# Patient Record
Sex: Female | Born: 2000 | Race: Black or African American | Hispanic: No | Marital: Single | Smoking: Never smoker
Health system: Southern US, Community
[De-identification: ages and names within clinical notes are randomized; demographics above are authoritative.]

## PROBLEM LIST (undated history)

## (undated) DIAGNOSIS — R569 Unspecified convulsions: Secondary | ICD-10-CM

---
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
## 2018-04-06 ENCOUNTER — Emergency Department (HOSPITAL_COMMUNITY)
Admission: EM | Admit: 2018-04-06 | Discharge: 2018-04-07 | Disposition: A | Discharge status: Home / Self Care | Payer: Medicaid Other | Attending: Emergency Medicine | Admitting: Emergency Medicine

## 2018-04-06 ENCOUNTER — Other Ambulatory Visit: Payer: Self-pay

## 2018-04-06 ENCOUNTER — Encounter (HOSPITAL_COMMUNITY): Payer: Self-pay | Admitting: Emergency Medicine

## 2018-04-06 DIAGNOSIS — S161XXA Strain of muscle, fascia and tendon at neck level, initial encounter: Secondary | ICD-10-CM | POA: Insufficient documentation

## 2018-04-06 DIAGNOSIS — Y999 Unspecified external cause status: Secondary | ICD-10-CM | POA: Diagnosis not present

## 2018-04-06 DIAGNOSIS — Y9389 Activity, other specified: Secondary | ICD-10-CM | POA: Diagnosis not present

## 2018-04-06 DIAGNOSIS — S199XXA Unspecified injury of neck, initial encounter: Secondary | ICD-10-CM | POA: Diagnosis present

## 2018-04-06 DIAGNOSIS — Y929 Unspecified place or not applicable: Secondary | ICD-10-CM | POA: Diagnosis not present
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
## 2018-04-07 ENCOUNTER — Emergency Department (HOSPITAL_COMMUNITY): Payer: Medicaid Other
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
## 2018-04-07 NOTE — Discharge Instructions (Addendum)

## 2018-04-07 NOTE — ED Notes (Signed)
Taken to ct at this time. 

## 2018-06-01 ENCOUNTER — Emergency Department (HOSPITAL_COMMUNITY)
Admission: EM | Admit: 2018-06-01 | Discharge: 2018-06-01 | Disposition: A | Discharge status: Home / Self Care | Payer: Medicaid Other | Attending: Emergency Medicine | Admitting: Emergency Medicine

## 2018-06-01 ENCOUNTER — Encounter (HOSPITAL_COMMUNITY): Payer: Self-pay | Admitting: Emergency Medicine

## 2018-06-01 DIAGNOSIS — Z79899 Other long term (current) drug therapy: Secondary | ICD-10-CM | POA: Insufficient documentation

## 2018-06-01 DIAGNOSIS — G44219 Episodic tension-type headache, not intractable: Secondary | ICD-10-CM | POA: Insufficient documentation

## 2018-06-01 DIAGNOSIS — R519 Headache, unspecified: Secondary | ICD-10-CM

## 2018-06-01 DIAGNOSIS — T701XXA Sinus barotrauma, initial encounter: Secondary | ICD-10-CM | POA: Insufficient documentation

## 2018-06-01 DIAGNOSIS — R51 Headache: Secondary | ICD-10-CM

## 2018-06-01 DIAGNOSIS — J3489 Other specified disorders of nose and nasal sinuses: Secondary | ICD-10-CM

## 2018-06-01 LAB — I-STAT CHEM 8, ED
BUN: 9 mg/dL (ref 6–20)
CREATININE: 0.9 mg/dL (ref 0.44–1.00)
Calcium, Ion: 1.15 mmol/L (ref 1.15–1.40)
Chloride: 101 mmol/L (ref 98–111)
Glucose, Bld: 84 mg/dL (ref 70–99)
HCT: 45 % (ref 36.0–46.0)
Hemoglobin: 15.3 g/dL — ABNORMAL HIGH (ref 12.0–15.0)
Potassium: 3.5 mmol/L (ref 3.5–5.1)
SODIUM: 138 mmol/L (ref 135–145)
TCO2: 25 mmol/L (ref 22–32)

## 2018-06-01 LAB — I-STAT BETA HCG BLOOD, ED (MC, WL, AP ONLY)

## 2018-06-01 MED ORDER — IBUPROFEN 400 MG PO TABS
600.0000 mg | ORAL_TABLET | Freq: Once | ORAL | Status: AC
  Administered 2018-06-01: 16:00:00 600 mg via ORAL
  Filled 2018-06-01: qty 1

## 2018-06-01 MED ORDER — PROCHLORPERAZINE MALEATE 5 MG PO TABS
10.0000 mg | ORAL_TABLET | Freq: Once | ORAL | Status: AC
  Administered 2018-06-01: 10 mg via ORAL
  Filled 2018-06-01: qty 2

## 2018-06-01 MED ORDER — DIPHENHYDRAMINE HCL 25 MG PO CAPS
25.0000 mg | ORAL_CAPSULE | Freq: Once | ORAL | Status: AC
  Administered 2018-06-01: 25 mg via ORAL
  Filled 2018-06-01: qty 1

## 2018-06-01 MED ORDER — DEXAMETHASONE 4 MG PO TABS
10.0000 mg | ORAL_TABLET | Freq: Once | ORAL | Status: AC
  Administered 2018-06-01: 10 mg via ORAL
  Filled 2018-06-01: qty 3

## 2018-06-01 MED ORDER — FLUTICASONE PROPIONATE 50 MCG/ACT NA SUSP: 1.0000 | Freq: Every day | NASAL | 0 refills | Status: AC

## 2018-06-01 NOTE — ED Provider Notes (Signed)
[facility] Provider Note   CSN: [id number] Arrival date & time: 06/01/18  1216     History   Chief Complaint Chief Complaint  Patient presents with  . Headache    HPI Anne Allen is a 17 y.o. female.  The history is provided by the patient.  Headache   This is a new problem. The current episode started more than 2 days ago. The problem occurs every few hours. The problem has not changed since onset.The headache is associated with bright light. The pain is located in the frontal region. The quality of the pain is described as dull. The pain is at a severity of 2/10. The pain is mild. The pain does not radiate. Pertinent negatives include no anorexia, no fever, no malaise/fatigue, no near-syncope, no orthopnea, no palpitations, no syncope, no shortness of breath, no nausea and no vomiting. She has tried nothing for the symptoms. The treatment provided no relief.    Past Medical History:  Diagnosis Date  . Seizures (HCC)    none in several years    Patient Active Problem List   Diagnosis Date Noted  . MDD (major depressive disorder), recurrent episode, moderate (HCC) 12/30/2016  . Suicidal ideation 12/30/2016  . Substance abuse (HCC) 12/30/2016    Past Surgical History:  Procedure Laterality Date  . hx blood clot right arm/vein bypass and removol of right upper rib  2017     OB History   None      Home Medications    Prior to Admission medications   Medication Sig Start Date End Date Taking? Authorizing Provider  fluticasone (FLONASE) 50 MCG/ACT nasal spray Place 1 spray into both nostrils daily. 06/01/18 07/01/18  Veretta Sabourin, DO  hydrOXYzine (ATARAX/VISTARIL) 25 MG tablet Take 1 tablet (25 mg total) by mouth 3 (three) times daily as needed for anxiety. 01/02/17   Denzil Magnusonhomas, Lashunda, NP    Family History No family history on file.  Social History Social History   Tobacco Use  . Smoking status: Never Smoker  . Smokeless  tobacco: Never Used  Substance Use Topics  . Alcohol use: No    Comment: BAC not available at time of assessment  . Drug use: Yes    Types: Marijuana    Comment: UDS not available at time of assessment     Allergies   Patient has no known allergies.   Review of Systems Review of Systems  Constitutional: Negative for chills, fever and malaise/fatigue.  HENT: Positive for congestion, postnasal drip, rhinorrhea, sinus pressure and sinus pain. Negative for ear pain and sore throat.   Eyes: Positive for photophobia. Negative for pain, discharge, redness, itching and visual disturbance.  Respiratory: Negative for cough and shortness of breath.   Cardiovascular: Negative for chest pain, palpitations, orthopnea, syncope and near-syncope.  Gastrointestinal: Negative for abdominal pain, anorexia, nausea and vomiting.  Genitourinary: Negative for dysuria and hematuria.  Musculoskeletal: Negative for arthralgias and back pain.  Skin: Negative for color change and rash.  Neurological: Positive for headaches. Negative for dizziness, seizures, syncope, facial asymmetry and light-headedness.  All other systems reviewed and are negative.    Physical Exam Updated Vital Signs  ED Triage Vitals  Enc Vitals Group     BP 06/01/18 1234 119/70     Pulse Rate 06/01/18 1234 81     Resp 06/01/18 1234 18     Temp 06/01/18 1234 98.8 F (37.1 C)     Temp Source 06/01/18 1234 Oral  SpO2 06/01/18 1234 100 %     Weight --      Height --      Head Circumference --      Peak Flow --      Pain Score 06/01/18 1232 7     Pain Loc --      Pain Edu? --      Excl. in GC? --     Physical Exam  Constitutional: She is oriented to person, place, and time. She appears well-developed and well-nourished. No distress.  HENT:  Head: Normocephalic and atraumatic.  TTP over maxillary and frontal sinuses  Eyes: Pupils are equal, round, and reactive to light. Conjunctivae and EOM are normal. Right eye exhibits  normal extraocular motion and no nystagmus. Left eye exhibits normal extraocular motion and no nystagmus. Right pupil is round and reactive. Left pupil is round and reactive.  Neck: Normal range of motion. Neck supple.  Cardiovascular: Normal rate and regular rhythm.  No murmur heard. Pulmonary/Chest: Effort normal and breath sounds normal. No respiratory distress.  Abdominal: Soft. There is no tenderness.  Musculoskeletal: She exhibits no edema.  Neurological: She is alert and oriented to person, place, and time. She has normal strength. She is not disoriented. No cranial nerve deficit or sensory deficit. Coordination and gait normal.  5+/5 strength, no drift, normal finger to nose finger  Skin: Skin is warm and dry. Capillary refill takes less than 2 seconds. No rash noted.  Psychiatric: She has a normal mood and affect.  Nursing note and vitals reviewed.    ED Treatments / Results  Labs (all labs ordered are listed, but only abnormal results are displayed) Labs Reviewed  I-STAT CHEM 8, ED - Abnormal; Notable for the following components:      Result Value   Hemoglobin 15.3 (*)    All other components within normal limits  I-STAT BETA HCG BLOOD, ED ([facility], [facility], [facility] ONLY)    EKG None  Radiology No results found.  Procedures Procedures (including critical care time)  Medications Ordered in ED Medications  dexamethasone (DECADRON) tablet 10 mg (has no administration in time range)  ibuprofen (ADVIL,MOTRIN) tablet 600 mg (has no administration in time range)  prochlorperazine (COMPAZINE) tablet 10 mg (10 mg Oral Given 06/01/18 1504)  diphenhydrAMINE (BENADRYL) capsule 25 mg (25 mg Oral Given 06/01/18 1504)     Initial Impression / Assessment and Plan / ED Course  I have reviewed the triage vital signs and the nursing notes.  Pertinent labs & imaging results that were available during my care of the patient were reviewed by me and considered in my medical decision making (see  chart for details).     Anne Allen is a 17 year old female history of seizures who presents to the ED with headache, body aches.  Patient with normal vitals.  No fever.  Patient states over the last several days she has had headache with sinus congestion, body aches, photophobia.  Patient with chills but denies any fevers.  No cough, no urinary symptoms.  No abdominal pain, no chest pain no shortness of breath.  Patient overall well-appearing.  Normal neurological exam.  Patient with tenderness over her maxillary and frontal sinuses.  Suspect viral process causing symptoms at this time.  No concern for intracranial process. Patient with no significant anemia, electrolyte abnormality, kidney injury. Pregnancy test negative.  Was given Compazine and Benadryl and motrin/decadron for sinus pain, headache with improvement.  Told to continue Tylenol and Motrin for symptoms.  Given prescription for Flonase.  Recommend increase hydration and discharged from ED in good condition.  Final Clinical Impressions(s) / ED Diagnoses   Final diagnoses:  Nonintractable headache, unspecified chronicity pattern, unspecified headache type  Sinus pressure    ED Discharge Orders         Ordered    fluticasone (FLONASE) 50 MCG/ACT nasal spray  Daily     06/01/18 1533           Virgina Norfolk, DO 06/01/18 1534

## 2018-06-01 NOTE — ED Notes (Signed)
With supervision of RN Hannie drew blood for I-Stats via butterfly

## 2018-06-01 NOTE — ED Triage Notes (Signed)
Pt reports headache and pain when she moves her eyes for the past 3 days, states she has not taken any medication for symptoms. resp e/u, a/o, ambulatory to triage with steady gait.

## 2018-06-01 NOTE — ED Notes (Signed)
Patient verbalizes understanding of discharge instructions. Opportunity for questioning and answers were provided. Pt discharged from ED. 

## 2018-10-25 DIAGNOSIS — F4010 Social phobia, unspecified: Secondary | ICD-10-CM | POA: Insufficient documentation

## 2019-05-23 DIAGNOSIS — R350 Frequency of micturition: Secondary | ICD-10-CM | POA: Insufficient documentation

## 2019-05-23 DIAGNOSIS — N390 Urinary tract infection, site not specified: Secondary | ICD-10-CM | POA: Insufficient documentation
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
## 2020-06-16 DIAGNOSIS — N939 Abnormal uterine and vaginal bleeding, unspecified: Secondary | ICD-10-CM | POA: Insufficient documentation

## 2020-10-13 ENCOUNTER — Telehealth: Payer: Self-pay | Admitting: *Deleted

## 2020-10-13 NOTE — Telephone Encounter (Addendum)
RN notified by HR that pt was sent home from work today due to stomach issues.  Spoke with pt by phone. She reports yesterday started having diarrhea at work starting about 1700. Denies n/v, abd pain. Decreased appetite. Reports 4 episodes in last 24 hours.  Discussed use of Imodium for sx. Denies fever.  Day 0 10/12/20 Day 5 10/17/20 RTW 10/18/20 with strict mask use thru 10/22/20.   Testing scheduled 1/13 at [facility] thru [facility].

## 2020-10-14 ENCOUNTER — Other Ambulatory Visit: Payer: Self-pay

## 2020-10-14 ENCOUNTER — Encounter: Payer: Self-pay | Admitting: *Deleted

## 2020-10-14 NOTE — Telephone Encounter (Signed)
Reviewed RN Haley note agree with plan of care. 
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
## 2020-10-15 NOTE — Telephone Encounter (Signed)
Reviewed RN Rolly Salter notes; noted symptoms improving saw PCM and had test at their office not [facility] location results pending.

## 2020-10-15 NOTE — Telephone Encounter (Signed)
Spoke with pt by phone. She reports having telehealth visit with pcp yesterday 1/13. Had testing done thru their office instead of the [facility] appt. PCR sent out 1/13. They advised 2-3 days for results.   She reports fever Wednesday night of 101F. Broke Thursday AM and has been normal temp since then. Diarrhea resolved yesterday morning and she had a normal appetite/diet yesterday.  Pending test result in order to RTW Monday 1/17 as planned. Plan f/u over the weekend.

## 2020-10-17 NOTE — Telephone Encounter (Signed)
Patient contacted via telephone  She reported symptoms resolved feeling well hasn't received any test results yet last checked last night.  Reviewed Epic Care Everywhere and positive covid test 10/14/2020 [facility].  Discussed with patient if she had any close work contacts and she stated no.  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  Patient reported not scheduled to work until 10/19/20.  She asked if supervisor approved her to change shift to 1/17 and I stated she would need to call her supervisor.  Discussed strict mask use at work through 10/22/20 not using employee lunch room or removing mask when inside building can use straw under mask to sip fluids.  Patient verbalized understanding information/instructions, agreed with plan of care and is going to follow up with her PCM.  Patient A&Ox3 respirations even and unlabored room air, no nasal sniffing/cough/throat clearing noted during 5 minute telephone call.  HR notified of positive test and strict mask use through 1/21 patient scheduled shift 1/18 return to work.

## 2020-10-19 NOTE — Telephone Encounter (Signed)
Noted reviewed RN Haley note 
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
## 2020-10-21 NOTE — Telephone Encounter (Signed)
Noted patient RTW as expected and strict mask use through 10/22/2020

## 2020-10-21 NOTE — Telephone Encounter (Signed)
Confirmed with HR pt did RTW 1/19 as expected. Closing encounter. 

## 2020-10-24 NOTE — Telephone Encounter (Signed)
Left message following up to ensure all symptoms resolved no questions or concerns as strict mask use period has ended and patient returned to work.  Message let patient know she is allowed to use lunch room again as long as symptoms improved/resolved.

## 2021-07-23 ENCOUNTER — Other Ambulatory Visit: Payer: Self-pay

## 2021-07-23 ENCOUNTER — Ambulatory Visit (HOSPITAL_COMMUNITY)
Admission: EM | Admit: 2021-07-23 | Discharge: 2021-07-23 | Disposition: A | Discharge status: Home / Self Care | Payer: Self-pay

## 2021-07-23 NOTE — ED Triage Notes (Signed)
Pt reports nausea, vomiting and chills. Pt requested pregnancy test and she wants to know how many weeks of pregnancy, for the abortion clinic.

## 2021-07-28 ENCOUNTER — Encounter (HOSPITAL_COMMUNITY): Payer: Self-pay | Admitting: Emergency Medicine

## 2021-07-28 ENCOUNTER — Inpatient Hospital Stay (HOSPITAL_COMMUNITY)
Admission: EM | Admit: 2021-07-28 | Discharge: 2021-07-28 | Disposition: A | Discharge status: Home / Self Care | Payer: Self-pay | Attending: Obstetrics & Gynecology | Admitting: Obstetrics & Gynecology

## 2021-07-28 ENCOUNTER — Inpatient Hospital Stay (HOSPITAL_COMMUNITY): Payer: Self-pay

## 2021-07-28 ENCOUNTER — Other Ambulatory Visit: Payer: Self-pay

## 2021-07-28 DIAGNOSIS — R112 Nausea with vomiting, unspecified: Secondary | ICD-10-CM

## 2021-07-28 DIAGNOSIS — R42 Dizziness and giddiness: Secondary | ICD-10-CM | POA: Insufficient documentation

## 2021-07-28 DIAGNOSIS — Z79899 Other long term (current) drug therapy: Secondary | ICD-10-CM | POA: Insufficient documentation

## 2021-07-28 DIAGNOSIS — O26891 Other specified pregnancy related conditions, first trimester: Secondary | ICD-10-CM

## 2021-07-28 DIAGNOSIS — Z3A01 Less than 8 weeks gestation of pregnancy: Secondary | ICD-10-CM

## 2021-07-28 DIAGNOSIS — R109 Unspecified abdominal pain: Secondary | ICD-10-CM

## 2021-07-28 DIAGNOSIS — O219 Vomiting of pregnancy, unspecified: Secondary | ICD-10-CM

## 2021-07-28 DIAGNOSIS — O26899 Other specified pregnancy related conditions, unspecified trimester: Secondary | ICD-10-CM

## 2021-07-28 DIAGNOSIS — R1030 Lower abdominal pain, unspecified: Secondary | ICD-10-CM

## 2021-07-28 LAB — URINALYSIS, ROUTINE W REFLEX MICROSCOPIC
Bilirubin Urine: NEGATIVE
Glucose, UA: NEGATIVE mg/dL
Hgb urine dipstick: NEGATIVE
Ketones, ur: NEGATIVE mg/dL
Leukocytes,Ua: NEGATIVE
Nitrite: NEGATIVE
Protein, ur: NEGATIVE mg/dL
Specific Gravity, Urine: 1.025 (ref 1.005–1.030)
pH: 7 (ref 5.0–8.0)

## 2021-07-28 LAB — CBC WITH DIFFERENTIAL/PLATELET
Abs Immature Granulocytes: 0.03 10*3/uL (ref 0.00–0.07)
Basophils Absolute: 0 10*3/uL (ref 0.0–0.1)
Basophils Relative: 0 %
Eosinophils Absolute: 0.1 10*3/uL (ref 0.0–0.5)
Eosinophils Relative: 2 %
HCT: 37.4 % (ref 36.0–46.0)
Hemoglobin: 12.7 g/dL (ref 12.0–15.0)
Immature Granulocytes: 0 %
Lymphocytes Relative: 24 %
Lymphs Abs: 1.8 10*3/uL (ref 0.7–4.0)
MCH: 31.1 pg (ref 26.0–34.0)
MCHC: 34 g/dL (ref 30.0–36.0)
MCV: 91.7 fL (ref 80.0–100.0)
Monocytes Absolute: 0.7 10*3/uL (ref 0.1–1.0)
Monocytes Relative: 9 %
Neutro Abs: 4.8 10*3/uL (ref 1.7–7.7)
Neutrophils Relative %: 65 %
Platelets: 280 10*3/uL (ref 150–400)
RBC: 4.08 MIL/uL (ref 3.87–5.11)
RDW: 11.6 % (ref 11.5–15.5)
WBC: 7.4 10*3/uL (ref 4.0–10.5)
nRBC: 0 % (ref 0.0–0.2)

## 2021-07-28 LAB — COMPREHENSIVE METABOLIC PANEL
ALT: 8 U/L (ref 0–44)
AST: 16 U/L (ref 15–41)
Albumin: 3.9 g/dL (ref 3.5–5.0)
Alkaline Phosphatase: 38 U/L (ref 38–126)
Anion gap: 8 (ref 5–15)
BUN: 8 mg/dL (ref 6–20)
CO2: 22 mmol/L (ref 22–32)
Calcium: 9.2 mg/dL (ref 8.9–10.3)
Chloride: 103 mmol/L (ref 98–111)
Creatinine, Ser: 0.76 mg/dL (ref 0.44–1.00)
GFR, Estimated: >60 mL/min (ref >60)
Glucose, Bld: 91 mg/dL (ref 70–99)
Potassium: 3.8 mmol/L (ref 3.5–5.1)
Sodium: 133 mmol/L — ABNORMAL LOW (ref 135–145)
Total Bilirubin: 0.5 mg/dL (ref 0.3–1.2)
Total Protein: 6.7 g/dL (ref 6.5–8.1)

## 2021-07-28 LAB — WET PREP, GENITAL
Clue Cells Wet Prep HPF POC: NONE SEEN
Sperm: NONE SEEN
Trich, Wet Prep: NONE SEEN
WBC, Wet Prep HPF POC: NONE SEEN
Yeast Wet Prep HPF POC: NONE SEEN

## 2021-07-28 LAB — HCG, QUANTITATIVE, PREGNANCY: hCG, Beta Chain, Quant, S: 116212 m[IU]/mL — ABNORMAL HIGH (ref <5)

## 2021-07-28 LAB — I-STAT BETA HCG BLOOD, ED (MC, WL, AP ONLY): I-stat hCG, quantitative: >2000 m[IU]/mL — ABNORMAL HIGH (ref <5)

## 2021-07-28 LAB — LIPASE, BLOOD: Lipase: 25 U/L (ref 11–51)

## 2021-07-28 MED ORDER — METOCLOPRAMIDE HCL 10 MG PO TABS: 10.0000 mg | ORAL_TABLET | Freq: Four times a day (QID) | ORAL | 1 refills | Status: AC | PRN

## 2021-07-28 NOTE — MAU Provider Note (Signed)
History     CSN: [id number]  Arrival date and time: 07/28/21 1129   Event Date/Time   First Provider Initiated Contact with Patient 07/28/21 1822      Chief Complaint  Patient presents with   Emesis   Nausea   Dizziness   Anne Allen is a 20 y.o. G1P0 at [redacted]w[redacted]d by Definite LMP .  She presents today, as a transfer from [facility], for Emesis, Nausea, and Dizziness.  She states she last ate 2 days ago and had "hot chips and sour gummies" before experiencing vomiting. She states she has been experiencing N/V for the past two weeks, but it has been manageable.  She reports she has not ate today because of the nausea, but has none currently.  Patient also reports abdominal cramping that she describes as "menstrual cramps, but worse."  She rates the pain a 6/10 and denies vaginal discharge or bleeding.  She states the pain is "below my stomach" and has no aggravating or relieving factors.  She denies current symptoms.    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  Social History   Tobacco Use   Smoking status: Never   Smokeless tobacco: Never  Substance Use Topics   Alcohol use: No    Comment: BAC not available at time of assessment   Drug use: Yes    Types: Marijuana    Comment: UDS not available at time of assessment    Allergies: No Known Allergies  Medications Prior to Admission  Medication Sig Dispense Refill Last Dose   doxycycline (ADOXA) 100 MG tablet Take 100 mg by mouth 2 (two) times daily.      fluticasone (FLONASE) 50 MCG/ACT nasal spray Place 1 spray into both nostrils daily. 16 g 0     hydrOXYzine (ATARAX/VISTARIL) 25 MG tablet Take 1 tablet (25 mg total) by mouth 3 (three) times daily as needed for anxiety. 30 tablet 0     Review of Systems  Constitutional:  Negative for chills and fever.  Gastrointestinal:  Positive for abdominal pain (Cramping), nausea and vomiting.  Genitourinary:  Negative for difficulty urinating, dysuria and vaginal bleeding.  Physical Exam   Blood pressure 113/65, pulse 75, temperature 98.7 F (37.1 C), temperature source Oral, resp. rate 18, height 5\' 4"  (1.626 m), weight 64.1 kg, last menstrual period 06/05/2021, SpO2 100 %.  Physical Exam HENT:     Head: Normocephalic and atraumatic.  Eyes:     Conjunctiva/sclera: Conjunctivae normal.  Cardiovascular:     Rate and Rhythm: Normal rate.  Pulmonary:     Effort: Pulmonary effort is normal. No respiratory distress.  Abdominal:     Tenderness: There is no abdominal tenderness.  Musculoskeletal:     Cervical back: Normal range of motion.  Neurological:     Mental Status: She is alert and oriented to person, place, and time.  Psychiatric:        Mood and Affect: Mood normal.        Behavior: Behavior normal.        Thought Content: Thought content normal.    MAU Course  Procedures Results for orders placed or performed during the hospital encounter of 07/28/21 (from the past 24 hour(s))  Urinalysis, Routine w reflex microscopic Urine, Clean Catch     Status: Abnormal   Collection Time: 07/28/21  1:11 PM  Result Value Ref Range   Color, Urine YELLOW YELLOW   APPearance HAZY (A) CLEAR   Specific Gravity, Urine 1.025 1.005 - 1.030   pH 7.0 5.0 - 8.0   Glucose, UA NEGATIVE NEGATIVE mg/dL   Hgb urine dipstick NEGATIVE NEGATIVE   Bilirubin Urine NEGATIVE NEGATIVE   Ketones, ur NEGATIVE NEGATIVE mg/dL   Protein, ur NEGATIVE NEGATIVE mg/dL   Nitrite NEGATIVE NEGATIVE   Leukocytes,Ua NEGATIVE NEGATIVE  CBC with Differential     Status: None   Collection Time: 07/28/21  1:56 PM  Result  Value Ref Range   WBC 7.4 4.0 - 10.5 K/uL   RBC 4.08 3.87 - 5.11 MIL/uL   Hemoglobin 12.7 12.0 - 15.0 g/dL   HCT 70.1 77.9 - 39.0 %   MCV 91.7 80.0 - 100.0 fL   MCH 31.1 26.0 - 34.0 pg   MCHC 34.0 30.0 - 36.0 g/dL   RDW 30.0 92.3 - 30.0 %   Platelets 280 150 - 400 K/uL   nRBC 0.0 0.0 - 0.2 %   Neutrophils Relative % 65 %   Neutro Abs 4.8 1.7 - 7.7 K/uL   Lymphocytes Relative 24 %   Lymphs Abs 1.8 0.7 - 4.0 K/uL   Monocytes Relative 9 %   Monocytes Absolute 0.7 0.1 - 1.0 K/uL   Eosinophils Relative 2 %   Eosinophils Absolute 0.1 0.0 - 0.5 K/uL   Basophils Relative 0 %   Basophils Absolute 0.0 0.0 - 0.1 K/uL   Immature Granulocytes 0 %   Abs Immature Granulocytes 0.03 0.00 - 0.07 K/uL  Comprehensive metabolic panel     Status: Abnormal   Collection Time: 07/28/21  1:56 PM  Result Value Ref Range   Sodium 133 (L) 135 - 145 mmol/L   Potassium 3.8 3.5 - 5.1 mmol/L   Chloride 103 98 - 111 mmol/L   CO2 22 22 - 32 mmol/L   Glucose, Bld 91 70 - 99 mg/dL   BUN 8 6 - 20 mg/dL   Creatinine, Ser 7.62 0.44 - 1.00 mg/dL   Calcium 9.2 8.9 - 26.3 mg/dL   Total Protein 6.7 6.5 - 8.1 g/dL   Albumin 3.9 3.5 - 5.0 g/dL   AST 16 15 - 41 U/L   ALT 8 0 - 44 U/L   Alkaline Phosphatase 38 38 - 126 U/L   Total Bilirubin 0.5 0.3 - 1.2 mg/dL   GFR, Estimated >33 >54 mL/min   Anion gap 8 5 - 15  Lipase, blood     Status: None   Collection Time: 07/28/21  1:56 PM  Result Value Ref Range   Lipase 25 11 - 51 U/L  POC beta hCG blood, ED     Status: Abnormal   Collection Time: 07/28/21  4:33 PM  Result Value Ref Range   I-stat hCG, quantitative >2,000.0 (H) <5 mIU/mL   Comment 3  US OB LESS THAN 14 WEEKS WITH OB TRANSVAGINAL  Result Date: 07/28/2021 CLINICAL DATA:  Pain and lower cramping EXAM: OBSTETRIC <14 WK [place] AND TRANSVAGINAL OB US TECHNIQUE: Both transabdominal and transvaginal ultrasound examinations were performed for complete evaluation of the gestation as well as the maternal  uterus, adnexal regions, and pelvic cul-de-sac. Transvaginal technique was performed to assess early pregnancy. COMPARISON:  None. FINDINGS: Intrauterine gestational sac: Single Yolk sac:  Visualized Embryo:  Visualized Cardiac Activity: Visualized Heart Rate: 155 bpm CRL:  10.5 mm   7 w   1 d                  [place] EDC: 03/15/2022 Subchorionic hemorrhage:  None visualized. Maternal uterus/adnexae: Ovaries are within normal limits. Left ovary measures 2.7 x 2.1 by 2.1 cm. Right ovary measures 1.4 x 2.3 x 1.3 cm. No significant free fluid IMPRESSION: Single viable intrauterine pregnancy as above. No specific abnormality is seen Electronically Signed   By: Jasmine Pang M.D.   On: 07/28/2021 19:19    MDM Exam Self Swab: Wet Prep and GC/CT Labs:Ordered and Collected in [facility], hCG added Ultrasound Assessment and Plan  20 year old G1P0 at 7.4 weeks Abdominal Pain Nausea/Vomiting  -POC Reviewed. -Exam performed. -Labs: Reviewed and Normal -Will send for [place] and await results. -Patient to collect cultures by self-swab. -Discussed dietary changes to promote decreased nausea/vomiting during pregnancy. -Encouraged frequent snacks that are high in protein. -Reviewed usage of antiemetics when nausea occurs. -Discussed Rx for reglan and patient w/o insurance. -Instructed to use Good rx for lowering costs. -Patient verbalizes undersanding and without questions. -Will send for [place] and await results.   Cherre Robins 07/28/2021, 6:23 PM   Reassessment (8:11 PM)  -Results as above. -J.Walker to bedside to discuss and provide discharge instructions.  -Discharge orders and prescription for Reglan completed by this provider. -Information for community providers placed in AVS.   Cherre Robins MSN, CNM Advanced Practice Provider, [facility] for [facility]

## 2021-07-28 NOTE — Discharge Instructions (Signed)
  [facility] Providers          [facility]  [address], [place], [place] [address]  [phone]  [facility]  [address], [place], [place] [address]  [phone]  [facility]  [address], [place], [place] [address]  [phone]  [facility] [place]  [address], [place], [place] [address]  [phone]  [facility]  [address] floor), [place], [place] [address]  [phone]  [facility] [address], [place], [place] [address] [phone]  [facility]  [address], [place], [place] [address]  [phone]  [facility]  [address], [place], [place] [address]  [phone]  [facility]  [address], [place], [place] [address]  [phone]  [facility]  [address], [place], [place] [address]  [phone]  [facility]  [address], [place], [place] [address]  [phone]  [facility]  [address], [place], [place] [address]  [phone]  [facility]   [address], [place], [place] [address]  [phone]  [facility]  [address], [place], [place] [address]   [phone]  [facility]  [address], [place],  [address]  [phone]         

## 2021-07-28 NOTE — ED Triage Notes (Addendum)
Patient here with complaint of nausea and emesis that is exacerbated by eating and started approximately one week ago. Patient alert, oriented, and in no apparent distress at this time.  Patient also states she thinks she may be pregnant. Patient states she took a home pregnancy test several weeks ago and it was negative.

## 2021-07-28 NOTE — ED Provider Notes (Addendum)
Emergency Medicine Provider Triage Evaluation Note  Anne Allen , a 20 y.o. female  was evaluated in triage.  Pt complains of 20 year old female with nausea, vomiting x 4 episodes in 1 week, abdominal pain- cramping in nature 3/10, lower.   Review of Systems  Positive: Nausea, vomiting, cramping abdominal pain, frequency/urgency, body aches Negative: Diarrhea, fever  Physical Exam  BP 98/66 (BP Location: Right Arm)   Pulse 85   Temp 98.5 F (36.9 C) (Oral)   Resp 18   LMP 06/05/2021 (Exact Date)   SpO2 98%  Gen:   Awake, no distress   Resp:  Normal effort  MSK:   Moves extremities without difficulty  Other:  No abdominal tenderness  Medical Decision Making  Medically screening exam initiated at 1:13 PM.  Appropriate orders placed.  Anne Allen was informed that the remainder of the evaluation will be completed by another provider, this initial triage assessment does not replace that evaluation, and the importance of remaining in the ED until their evaluation is complete.     [name], PA-C 07/28/21 1314    [name], MD 07/28/21 1623   Quant HCG greater than 2,000. Call to MAU APP who accepts patient in transfer to MAU for remainder of work up.    [name], PA-C 07/28/21 1656    [name], MD 07/29/21 [phone]

## 2021-07-28 NOTE — Progress Notes (Signed)
GC/Chlamydia and wet prep cultures obtained via pt self swabbing. 

## 2021-07-28 NOTE — MAU Note (Signed)
Sent from [facility] ED, states she's nauseated and has dizziness.

## 2021-07-29 LAB — GC/CHLAMYDIA PROBE AMP (~~LOC~~) NOT AT ARMC
Chlamydia: NEGATIVE
Comment: NEGATIVE
Comment: NORMAL
Neisseria Gonorrhea: NEGATIVE

## 2022-04-30 ENCOUNTER — Emergency Department (HOSPITAL_COMMUNITY)
Admission: EM | Admit: 2022-04-30 | Discharge: 2022-04-30 | Disposition: A | Discharge status: Home / Self Care | Payer: Managed Care, Other (non HMO) | Attending: Emergency Medicine | Admitting: Emergency Medicine

## 2022-04-30 ENCOUNTER — Encounter (HOSPITAL_COMMUNITY): Payer: Self-pay

## 2022-04-30 ENCOUNTER — Other Ambulatory Visit: Payer: Self-pay

## 2022-04-30 ENCOUNTER — Emergency Department (HOSPITAL_COMMUNITY): Payer: Managed Care, Other (non HMO)

## 2022-04-30 DIAGNOSIS — U071 COVID-19: Secondary | ICD-10-CM | POA: Insufficient documentation

## 2022-04-30 DIAGNOSIS — Z86718 Personal history of other venous thrombosis and embolism: Secondary | ICD-10-CM | POA: Diagnosis not present

## 2022-04-30 DIAGNOSIS — D72819 Decreased white blood cell count, unspecified: Secondary | ICD-10-CM | POA: Insufficient documentation

## 2022-04-30 DIAGNOSIS — Z28311 Partially vaccinated for covid-19: Secondary | ICD-10-CM

## 2022-04-30 DIAGNOSIS — R509 Fever, unspecified: Secondary | ICD-10-CM | POA: Diagnosis present

## 2022-04-30 LAB — CBC WITH DIFFERENTIAL/PLATELET
Abs Immature Granulocytes: 0.01 10*3/uL (ref 0.00–0.07)
Basophils Absolute: 0 10*3/uL (ref 0.0–0.1)
Basophils Relative: 0 %
Eosinophils Absolute: 0 10*3/uL (ref 0.0–0.5)
Eosinophils Relative: 0 %
HCT: 40.1 % (ref 36.0–46.0)
Hemoglobin: 13.2 g/dL (ref 12.0–15.0)
Immature Granulocytes: 0 %
Lymphocytes Relative: 12 %
Lymphs Abs: 0.5 10*3/uL — ABNORMAL LOW (ref 0.7–4.0)
MCH: 30.8 pg (ref 26.0–34.0)
MCHC: 32.9 g/dL (ref 30.0–36.0)
MCV: 93.5 fL (ref 80.0–100.0)
Monocytes Absolute: 0.6 10*3/uL (ref 0.1–1.0)
Monocytes Relative: 16 %
Neutro Abs: 2.8 10*3/uL (ref 1.7–7.7)
Neutrophils Relative %: 72 %
Platelets: 237 10*3/uL (ref 150–400)
RBC: 4.29 MIL/uL (ref 3.87–5.11)
RDW: 11.5 % (ref 11.5–15.5)
WBC: 3.9 10*3/uL — ABNORMAL LOW (ref 4.0–10.5)
nRBC: 0 % (ref 0.0–0.2)

## 2022-04-30 LAB — COMPREHENSIVE METABOLIC PANEL
ALT: 11 U/L (ref 0–44)
AST: 18 U/L (ref 15–41)
Albumin: 3.8 g/dL (ref 3.5–5.0)
Alkaline Phosphatase: 38 U/L (ref 38–126)
Anion gap: 6 (ref 5–15)
BUN: 8 mg/dL (ref 6–20)
CO2: 23 mmol/L (ref 22–32)
Calcium: 8.7 mg/dL — ABNORMAL LOW (ref 8.9–10.3)
Chloride: 107 mmol/L (ref 98–111)
Creatinine, Ser: 0.98 mg/dL (ref 0.44–1.00)
GFR, Estimated: >60 mL/min (ref >60)
Glucose, Bld: 94 mg/dL (ref 70–99)
Potassium: 3.3 mmol/L — ABNORMAL LOW (ref 3.5–5.1)
Sodium: 136 mmol/L (ref 135–145)
Total Bilirubin: 1.1 mg/dL (ref 0.3–1.2)
Total Protein: 6.5 g/dL (ref 6.5–8.1)

## 2022-04-30 LAB — I-STAT BETA HCG BLOOD, ED (MC, WL, AP ONLY): I-stat hCG, quantitative: <5 m[IU]/mL (ref <5)

## 2022-04-30 LAB — SARS CORONAVIRUS 2 BY RT PCR: SARS Coronavirus 2 by RT PCR: POSITIVE — AB

## 2022-04-30 MED ORDER — ACETAMINOPHEN 500 MG PO TABS
1000.0000 mg | ORAL_TABLET | Freq: Once | ORAL | Status: AC
  Administered 2022-04-30: 1000 mg via ORAL
  Filled 2022-04-30: qty 2

## 2022-04-30 MED ORDER — NIRMATRELVIR/RITONAVIR (PAXLOVID)TABLET: 3.0000 | ORAL_TABLET | Freq: Two times a day (BID) | ORAL | 0 refills | Status: AC

## 2022-04-30 NOTE — Discharge Instructions (Addendum)
Please take Ibuprofen (Advil, motrin) and Tylenol (acetaminophen) to relieve your pain.    You may take up to 600 MG (3 pills) of normal strength ibuprofen every 8 hours as needed.   You make take tylenol, up to 1,000 mg (two extra strength pills) every 8 hours as needed.   It is safe to take ibuprofen and tylenol at the same time as they work differently.   Do not take more than 3,000 mg tylenol in a 24 hour period (not more than one dose every 8 hours.  Please check all medication labels as many medications such as pain and cold medications may contain tylenol.  Do not drink alcohol while taking these medications.  Do not take other NSAID'S while taking ibuprofen (such as aleve or naproxen).  Please take ibuprofen with food to decrease stomach upset.  As we discussed today your first day of symptoms counts is day 0.

## 2022-04-30 NOTE — ED Triage Notes (Signed)
Patient complains of fever, cough, congestion and body aches with headache since Friday. Patient alert and oriented, denies sore throat

## 2022-04-30 NOTE — ED Provider Notes (Signed)
[facility] EMERGENCY DEPARTMENT Provider Note   CSN: [id number] Arrival date & time: 04/30/22  1191     History  No chief complaint on file.   Anne Allen is a 21 y.o. female who presents today for evaluation of headache, cough, fevers at home.  Her symptoms started on Friday 7/28.  She states that she has previously had COVID.  She has not been vaccinated against COVID.  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  She feels short of breath.  She denies chest pain.  She is not currently on any medications.  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  Is not complaining of any arm pain or swelling.  She has not taken any antipyretics since last night.  She denies nausea vomiting or diarrhea.    HPI     Home Medications Prior to Admission medications   Medication Sig Start Date End Date Taking? Authorizing Provider  nirmatrelvir/ritonavir EUA (PAXLOVID) 20 x 150 MG & 10 x 100MG  TABS Take 3 tablets by mouth 2 (two) times daily for 5 days. Patient GFR is over 60 Take nirmatrelvir (150 mg) two tablets twice daily for 5 days and ritonavir (100 mg) one tablet twice daily for 5 days. 04/30/22 05/05/22 Yes 07/05/22, PA-C  doxycycline (ADOXA) 100 MG tablet Take 100 mg by mouth 2 (two) times daily. 09/27/20   [provider]  fluticasone (FLONASE) 50 MCG/ACT nasal spray Place 1 spray into both nostrils daily. 06/01/18 07/01/18  Curatolo, Adam, DO  hydrOXYzine (ATARAX/VISTARIL) 25 MG tablet Take 1 tablet (25 mg total) by mouth 3 (three) times daily as needed for anxiety. 01/02/17   03/04/17, NP  metoCLOPramide (REGLAN) 10 MG tablet Take 1 tablet (10 mg total) by mouth every 6 (six) hours as needed for nausea. 07/28/21   07/30/21, CNM      Allergies    Patient has no known allergies.      Physical Exam Updated Vital Signs BP 114/71 (BP Location: Right Arm)   Pulse 81    Temp 99 F (37.2 C) (Oral)   Resp 20   LMP 05/29/2021 (Within Days)   SpO2 97%   Breastfeeding Unknown  Physical Exam Vitals and nursing note reviewed.  Constitutional:      General: She is not in acute distress.    Appearance: She is not ill-appearing.  HENT:     Head: Atraumatic.  Eyes:     Conjunctiva/sclera: Conjunctivae normal.  Neck:     Comments: Full active range of motion of the head/neck.  She is able to touch her chin to her chest without difficulty. Cardiovascular:     Rate and Rhythm: Normal rate.  Pulmonary:     Effort: Pulmonary effort is normal. No respiratory distress.  Abdominal:     General: There is no distension.  Musculoskeletal:     Cervical back: Normal range of motion and neck supple.     Comments: No obvious acute injury  Skin:    General: Skin is warm.  Neurological:     Mental Status: She is alert.     Comments: Awake and alert, answers all questions appropriately.  Speech is not slurred.  Psychiatric:        Mood and Affect: Mood normal.        Behavior: Behavior normal.    I personally had patient's stand and ambulate at bedside.  She was minimally symptomatic/short of breath.  She did not become hypoxic maintaining oxygen saturations over 97% with a good waveform. ED Results / Procedures / Treatments   Labs (all labs ordered are listed, but only abnormal results are displayed) Labs Reviewed  SARS CORONAVIRUS 2 BY RT PCR - Abnormal; Notable for the following components:      Result Value   SARS Coronavirus 2 by RT PCR POSITIVE (*)    All other components within normal limits  COMPREHENSIVE METABOLIC PANEL - Abnormal; Notable for the following components:   Potassium 3.3 (*)    Calcium 8.7 (*)    All other components within normal limits  CBC WITH DIFFERENTIAL/PLATELET - Abnormal; Notable for the following components:   WBC 3.9 (*)    Lymphs Abs 0.5 (*)    All other components within normal limits  I-STAT BETA HCG BLOOD, ED ([facility], [facility], [facility]  ONLY)    EKG None  Radiology DG Chest Portable 1 View  Result Date: 04/30/2022 CLINICAL DATA:  COVID positive, shortness of breath. EXAM: PORTABLE CHEST 1 VIEW COMPARISON:  Chest radiograph 04/07/2018. FINDINGS: The heart size and mediastinal contours are within normal limits. Both lungs are clear. No pleural effusion or pneumothorax. Surgical staples noted at the right apex as well as postoperative changes of the right first rib. IMPRESSION: No acute cardiopulmonary abnormality. Electronically Signed   By: Sherron Ales M.D.   On: 04/30/2022 12:31    Procedures Procedures    Medications Ordered in ED Medications  acetaminophen (TYLENOL) tablet 1,000 mg (1,000 mg Oral Given 04/30/22 1150)    ED Course/ Medical Decision Making/ A&P Clinical Course as of 04/30/22 1901  Sun Apr 30, 2022  1236 WBC(!): 3.9 Mild leukopenia, not uncommon in the setting of COVID. [EH]  1304 Patient is reevaluated.  We discussed her results.  She is feeling slightly better after Tylenol. [EH]  1900 SARS Coronavirus 2 by RT PCR (hospital order, performed in [facility] hospital lab) *cepheid single result test* Anterior Nasal Swab(!) [EH]  1900 DG Chest Portable 1 View No acute clinically significant abnormalities. [EH]  1901 Comprehensive metabolic panel(!) No Clinically significant derangements. [EH]    Clinical Course User Index [EH] Cristina Gong, PA-C                           Medical Decision Making Patient is a 21 year old woman with a past medical history of upper extremity DVT felt to be secondary to thoracic outlet syndrome, not currently on any medications, who presents today for evaluation of shortness of breath, headache, and fever.  Her symptoms began on the evening of 04/28/2022.  She has previously had COVID however is not vaccinated. I personally had her ambulate at bedside, she maintained her oxygen saturations over 97% and was minimally short of breath.  With her ongoing shortness  of breath chest x-ray was obtained with out  acute clinically significant abnormality.   As she does not have recent labs/creatinine on file in order to be eligible for Paxlovid labs were obtained and reviewed, please see ED course.  Given that she is not up-to-date on COVID vaccines and has not had any COVID vaccines she is a high risk for progressing to severe COVID.  I had a extensive discussion with her regarding Paxlovid and possible risks benefits and alternatives. Through shared decision making we will provide prescription for Paxlovid.  At this time patient does not appear to require  admission.  She is able to ambulate without significant desaturations with reassuring vitals, lab work and chest x-ray.  Recommended conservative care at home.  Return precautions were discussed with patient who states their understanding.  At the time of discharge patient denied any unaddressed complaints or concerns.  Patient is agreeable for discharge home.  Note: Portions of this report may have been transcribed using voice recognition software. Every effort was made to ensure accuracy; however, inadvertent computerized transcription errors may be present    Problems Addressed: COVID-19: acute illness or injury  Amount and/or Complexity of Data Reviewed Labs: ordered. Decision-making details documented in ED Course. Radiology: ordered.  Risk OTC drugs. Decision regarding hospitalization.    Anne Allen was evaluated in Emergency Department on 04/30/2022 for the symptoms described in the history of present illness. She was evaluated in the context of the global COVID-19 pandemic, which necessitated consideration that the patient might be at risk for infection with the SARS-CoV-2 virus that causes COVID-19. Institutional protocols and algorithms that pertain to the evaluation of patients at risk for COVID-19 are in a state of rapid change based on information released by regulatory bodies  including the CDC and federal and state organizations. These policies and algorithms were followed during the patient's care in the ED.        Final Clinical Impression(s) / ED Diagnoses Final diagnoses:  COVID-19  COVID-19 vaccine series not completed    Rx / DC Orders ED Discharge Orders          Ordered    nirmatrelvir/ritonavir EUA (PAXLOVID) 20 x 150 MG & 10 x 100MG  TABS  2 times daily        04/30/22 1313              05/02/22 04/30/22 05/02/22, MD 04/30/22 1940

## 2025-06-03 ENCOUNTER — Ambulatory Visit: Admitting: Physician Assistant
